# Patient Record
Sex: Female | Born: 1975 | Hispanic: No | Marital: Married
Health system: Southern US, Community
[De-identification: ages and names within clinical notes are randomized; demographics above are authoritative.]

## PROBLEM LIST (undated history)

## (undated) DIAGNOSIS — F329 Major depressive disorder, single episode, unspecified: Secondary | ICD-10-CM

## (undated) DIAGNOSIS — F32A Depression, unspecified: Secondary | ICD-10-CM

## (undated) DIAGNOSIS — G43909 Migraine, unspecified, not intractable, without status migrainosus: Secondary | ICD-10-CM

---

## 2018-05-30 ENCOUNTER — Other Ambulatory Visit: Payer: Self-pay

## 2018-05-30 ENCOUNTER — Emergency Department (HOSPITAL_BASED_OUTPATIENT_CLINIC_OR_DEPARTMENT_OTHER): Payer: Worker's Compensation

## 2018-05-30 ENCOUNTER — Encounter (HOSPITAL_BASED_OUTPATIENT_CLINIC_OR_DEPARTMENT_OTHER): Payer: Self-pay

## 2018-05-30 ENCOUNTER — Emergency Department (HOSPITAL_BASED_OUTPATIENT_CLINIC_OR_DEPARTMENT_OTHER)
Admission: EM | Admit: 2018-05-30 | Discharge: 2018-05-30 | Disposition: A | Discharge status: Home / Self Care | Payer: Worker's Compensation | Attending: Emergency Medicine | Admitting: Emergency Medicine

## 2018-05-30 DIAGNOSIS — Y9389 Activity, other specified: Secondary | ICD-10-CM | POA: Insufficient documentation

## 2018-05-30 DIAGNOSIS — S0990XA Unspecified injury of head, initial encounter: Secondary | ICD-10-CM

## 2018-05-30 DIAGNOSIS — W01198A Fall on same level from slipping, tripping and stumbling with subsequent striking against other object, initial encounter: Secondary | ICD-10-CM | POA: Diagnosis not present

## 2018-05-30 DIAGNOSIS — Y9289 Other specified places as the place of occurrence of the external cause: Secondary | ICD-10-CM | POA: Insufficient documentation

## 2018-05-30 DIAGNOSIS — S39012A Strain of muscle, fascia and tendon of lower back, initial encounter: Secondary | ICD-10-CM | POA: Diagnosis not present

## 2018-05-30 DIAGNOSIS — E039 Hypothyroidism, unspecified: Secondary | ICD-10-CM | POA: Diagnosis not present

## 2018-05-30 DIAGNOSIS — Y990 Civilian activity done for income or pay: Secondary | ICD-10-CM | POA: Insufficient documentation

## 2018-05-30 DIAGNOSIS — Z79899 Other long term (current) drug therapy: Secondary | ICD-10-CM | POA: Diagnosis not present

## 2018-05-30 DIAGNOSIS — W19XXXA Unspecified fall, initial encounter: Secondary | ICD-10-CM

## 2018-05-30 HISTORY — DX: Major depressive disorder, single episode, unspecified: F32.9

## 2018-05-30 HISTORY — DX: Depression, unspecified: F32.A

## 2018-05-30 HISTORY — DX: Migraine, unspecified, not intractable, without status migrainosus: G43.909

## 2018-05-30 LAB — PREGNANCY, URINE: Preg Test, Ur: NEGATIVE

## 2018-05-30 MED ORDER — METHOCARBAMOL 500 MG PO TABS: 500.0000 mg | ORAL_TABLET | Freq: Two times a day (BID) | ORAL | 0 refills | Status: AC

## 2018-05-30 MED ORDER — ACETAMINOPHEN 500 MG PO TABS
1000.0000 mg | ORAL_TABLET | Freq: Once | ORAL | Status: AC
  Administered 2018-05-30: 1000 mg via ORAL
  Filled 2018-05-30: qty 2

## 2018-05-30 MED ORDER — NAPROXEN 375 MG PO TABS: 375.0000 mg | ORAL_TABLET | Freq: Two times a day (BID) | ORAL | 0 refills | Status: AC

## 2018-05-30 MED ORDER — METHOCARBAMOL 500 MG PO TABS
500.0000 mg | ORAL_TABLET | Freq: Once | ORAL | Status: AC
  Administered 2018-05-30: 500 mg via ORAL
  Filled 2018-05-30: qty 1

## 2018-05-30 NOTE — Discharge Instructions (Addendum)
Please see the information and instructions below regarding your visit.  Your diagnoses today include:  1. Fall, initial encounter   2. Strain of lumbar region, initial encounter   3. Mild closed head injury, initial encounter    Your imaging is normal today. It shows no signs of bleeding in the brain. Concussions are caused by acceleration/deceleration forces of the brain against the skull and in mild forms, cannot be seen on any imaging. The injury occurs at the microscopic level, and causes a disturbance more in function than the structure of the brain itself. Common symptoms of concussion include: ?Poor coordination such as stumbling or inability to walk in a straight line ?Vacant stare (befuddled facial expression) ?Delayed verbal expression (slower to answer questions or follow instructions) ?Inability to focus attention (easily distracted and unable to follow through with normal activities) ?Disorientation (walking in the wrong direction, unaware of time, date, place) ?Slurred or incoherent speech (making disjointed or incomprehensible statements) ?Emotionality out of proportion to circumstances (appearing distraught, crying for no apparent reason) ?Memory deficits (exhibited by patient repeatedly asking the same question that has already been answered or inability to recall three of three words after five minutes)  Tests performed today include: CT scan showed no bleeding in the brain. See side panel of your discharge paperwork for testing performed today. Vital signs are listed at the bottom of these instructions.   X-ray of your lower back shows no fracture.  Medications prescribed:    Avoid aspirin. Take only ibuprofen (Advil) or naproxen (Aleve), and acetaminophen (Tylenol).  You are prescribed naproxen, a non-steroidal anti-inflammatory agent (NSAID) for pain. You may take 375mg  every 12 hours as needed for pain. If still requiring this medication around the clock for acute  pain after 10 days, please see your primary healthcare provider.  Women who are pregnant, breastfeeding, or planning on becoming pregnant should not take non-steroidal anti-inflammatories such as Advil and Aleve. Tylenol is a safe over the counter pain reliever in pregnant women.  You may combine this medication with Tylenol, 650 mg every 6 hours, so you are receiving something for pain every 3 hours.  This is not a long-term medication unless under the care and direction of your primary provider. Taking this medication long-term and not under the supervision of a healthcare provider could increase the risk of stomach ulcers, kidney problems, and cardiovascular problems such as high blood pressure.    You are prescribed Robaxin, a muscle relaxant. Some common side effects of this medication include:  Feeling sleepy.  Dizziness. Take care upon going from a seated to a standing position.  Dry mouth.  Feeling tired or weak.  Hard stools (constipation).  Upset stomach. These are not all of the side effects that may occur. If you have questions about side effects, call your doctor. Call your primary care provider for medical advice about side effects.  This medication can be sedating. Only take this medication as needed. Please do not combine with alcohol. Do not drive or operate machinery while taking this medication.   This medication can interact with some other medications. Make sure to tell any provider you are taking this medication before they prescribe you a new medication.   Take any prescribed medications only as prescribed, and any over the counter medications only as directed on the packaging.  Home care instructions:  Please follow any educational materials contained in this packet.    Keep head elevated at all times for the first 24 hours (Elevate  mattress if pillow is ineffective)  Do not take tranquilizers, sedatives, narcotics or alcohol  Use ice packs for  comfort  Follow-up instructions: Please follow-up with your primary care provider in one week for further evaluation of your symptoms if they are not completely improved.   Return instructions:  Please return to the Emergency Department if you experience worsening symptoms.   If any of the following occur notify your physician or go to the Hospital Emergency Department:  Increased drowsiness, confusion, or loss of consciousness  Restlessness or convulsions (fits)  Paralysis in arms or legs  Temperature above 100 F  Vomiting  Severe headache  Blood or clear fluid dripping from the nose or ears  Stiffness of the neck  Dizziness or blurred vision  Pulsating pain in the eye  Unequal pupils of eye  Personality changes  Any other unusual symptoms  Please return for any weakness in lower extremities, numbness, numbness in the groin, loss of bowel or bladder control.  Additional Information:   Your vital signs today were: BP 105/75 (BP Location: Right Arm)    Pulse (!) 59    Temp 97.9 F (36.6 C) (Oral)    Resp 18    Ht 5\' 2"  (1.575 m)    Wt 59 kg    LMP 05/30/2018    SpO2 100%    BMI 23.78 kg/m  If your blood pressure (BP) was elevated on multiple readings during this visit above 130 for the top number or above 80 for the bottom number, please have this repeated by your primary care provider within one month. --------------  Thank you for allowing [place] to participate in your care today. It was my pleasure to care for you!

## 2018-05-30 NOTE — ED Notes (Signed)
Patient ambulated to restroom with assistance.

## 2018-05-30 NOTE — ED Triage Notes (Signed)
Pt states she tripped over a palette and fell backwards, c/o head and lower back pain and thinks she passed out

## 2018-05-30 NOTE — ED Provider Notes (Signed)
[facility] EMERGENCY DEPARTMENT Provider Note   CSN: [id number] Arrival date & time: 05/30/18  1555     History   Chief Complaint Chief Complaint  Patient presents with  . Fall    HPI Sophia Gonzalez is a 42 y.o. female.  HPI   Patient is a 42 year old female with a history of migraine, hypothyroidism, and depression presenting for fall and subsequent head and low back pain.  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  Patient reports was a purely mechanical fall did not experience any presyncopal sensation prior to fall.  Patient denies loss of consciousness, but does report that she has difficulty remembering the events surrounding the fall.  She denies any visual disturbance, vomiting, or blood thinning medications.  Patient is also reporting low back pain, reports she has pain shooting down bilateral legs.  Patient denies any lower extreme any weakness, numbness, saddle anesthesia, loss of bowel or bladder control.  No remedies prior to arrival for symptoms.  Past Medical History:  Diagnosis Date  . Depression   . Migraine     There are no active problems to display for this patient.   Past Surgical History:  Procedure Laterality Date  . CESAREAN SECTION       OB History   None      Home Medications    Prior to Admission medications   Medication Sig Start Date End Date Taking? Authorizing Provider  levothyroxine (SYNTHROID, LEVOTHROID) 100 MCG tablet Take 100 mcg by mouth daily before breakfast.   Yes [provider]    Family History No family history on file.  Social History Social History   Tobacco Use  . Smoking status: Not on file  Substance Use Topics  . Alcohol use: Not on file  . Drug use: Not on file     Allergies   Patient has no known allergies.   Review of Systems Review of Systems  Eyes: Negative for visual disturbance.    Gastrointestinal: Negative for nausea and vomiting.  Musculoskeletal: Positive for back pain.  Neurological: Positive for dizziness and headaches. Negative for syncope, weakness, light-headedness and numbness.  All other systems reviewed and are negative.    Physical Exam Updated Vital Signs BP 105/75 (BP Location: Right Arm)   Pulse (!) 59   Temp 97.9 F (36.6 C) (Oral)   Resp 18   Ht 5\' 2"  (1.575 m)   Wt 59 kg   LMP 05/30/2018   SpO2 100%   BMI 23.78 kg/m   Physical Exam  Constitutional: She appears well-developed and well-nourished. No distress.  HENT:  Head: Normocephalic and atraumatic.  Mouth/Throat: Oropharynx is clear and moist.  Eyes: Pupils are equal, round, and reactive to light. Conjunctivae and EOM are normal.  Neck: Normal range of motion. Neck supple.  Cardiovascular: Normal rate, regular rhythm, S1 normal and S2 normal.  No murmur heard. Pulmonary/Chest: Effort normal and breath sounds normal. She has no wheezes. She has no rales.  Abdominal: Soft. She exhibits no distension. There is no tenderness. There is no guarding.  Musculoskeletal: Normal range of motion. She exhibits no edema or deformity.  Spine Exam: Inspection/Palpation: No midline tenderness to palpation of cervical, thoracic, or lumbar spine.  Patient does have diffuse right-sided lumbar paraspinal muscular tenderness, and mild left-sided lumbar paraspinal muscular tenderness. Strength: 5/5 throughout LE bilaterally (hip flexion/extension, adduction/abduction; knee flexion/extension; foot dorsiflexion/plantarflexion, inversion/eversion;  great toe inversion) Sensation: Intact to light touch in proximal and distal LE bilaterally Reflexes: 2+ quadriceps and achilles reflexes Slight antalgic gait, favoring right.  Lymphadenopathy:    She has no cervical adenopathy.  Neurological: She is alert. GCS eye subscore is 4. GCS verbal subscore is 5. GCS motor subscore is 6.  Cranial nerves grossly  intact. Patient moves extremities symmetrically and with good coordination.  Skin: Skin is warm and dry. No rash noted. No erythema.  Psychiatric: She has a normal mood and affect. Her behavior is normal. Judgment and thought content normal.  Nursing note and vitals reviewed.    ED Treatments / Results  Labs (all labs ordered are listed, but only abnormal results are displayed) Labs Reviewed  PREGNANCY, URINE    EKG None  Radiology Dg Lumbar Spine Complete  Result Date: 05/30/2018 CLINICAL DATA:  Lumbar pain radiating down RIGHT lower extremity after a fall today, tripped over pallets at work EXAM: LUMBAR SPINE - COMPLETE 4+ VIEW COMPARISON:  None FINDINGS: 5 non-rib-bearing lumbar vertebra. Osseous mineralization normal for technique. Vertebral body and disc space heights maintained. No acute fracture, subluxation, or bone destruction. No spondylolysis. SI joints preserved. IMPRESSION: Normal exam. Electronically Signed   By: Ulyses SouthwardMark  Boles M.D.   On: 05/30/2018 17:51   Ct Head Wo Contrast  Result Date: 05/30/2018 CLINICAL DATA:  Patient tripped over a pallet and fell backwards, presenting with head and low back pain. Patient believes she passed out. EXAM: CT HEAD WITHOUT CONTRAST TECHNIQUE: Contiguous axial images were obtained from the base of the skull through the vertex without intravenous contrast. COMPARISON:  None. FINDINGS: Brain: No evidence of acute infarction, hemorrhage, hydrocephalus, extra-axial collection or mass lesion/mass effect. Vascular: No hyperdense vessel or unexpected calcification. Skull: Negative for fracture or focal lesion. Sinuses/Orbits: No acute finding. Other: None IMPRESSION: No skull fracture nor acute intracranial abnormality identified. Electronically Signed   By: Tollie Ethavid  Kwon M.D.   On: 05/30/2018 17:28    Procedures Procedures (including critical care time)  Medications Ordered in ED Medications  acetaminophen (TYLENOL) tablet 1,000 mg (1,000 mg  Oral Given 05/30/18 1728)  methocarbamol (ROBAXIN) tablet 500 mg (500 mg Oral Given 05/30/18 1948)     Initial Impression / Assessment and Plan / ED Course  I have reviewed the triage vital signs and the nursing notes.  Pertinent labs & imaging results that were available during my care of the patient were reviewed by me and considered in my medical decision making (see chart for details).  Clinical Course as of May 30 1958  Tue May 30, 2018  1946 Patient verbally verified a safe ride from the ED. Proceeded with prescribing Robaxin for pain/relaxtion/muscle relaxation in the ED.   [AM]    Clinical Course User Index [AM] Elisha PonderMurray, Aryannah Mohon B, PA-C    Patient is well-appearing and in no acute distress.  Patient with mechanical fall from standing.  Patient did have some possible retrograde amnesia, but no other neurologic symptoms.  Work-up significant for negative head CT for intracranial bleeding, and negative lumbar spine radiographs for acute fracture or malalignment.  We will treat patient for muscular skeletal strain, with rest, gentle exercises, naproxen, and muscle relaxants.  Patient instructed not to drive, drink alcohol, operate machinery while taking muscle relaxants.  Return precautions were given for any changes in mentation, persistent vomiting, saddle anesthesia, loss of bowel bladder control or numbness or weakness in lower extremities.  Patient is in understanding and agrees with the plan of  care.  I also discussed concussion with the patient, and gave return precautions as well as follow-up instructions for this.  Final Clinical Impressions(s) / ED Diagnoses   Final diagnoses:  Fall, initial encounter  Strain of lumbar region, initial encounter  Mild closed head injury, initial encounter    ED Discharge Orders         Ordered    methocarbamol (ROBAXIN) 500 MG tablet  2 times daily     05/30/18 2008    naproxen (NAPROSYN) 375 MG tablet  2 times daily     05/30/18 2008            Delia Chimes 05/30/18 2012    Arby Barrette, MD 05/31/18 2340

## 2019-05-27 IMAGING — CR DG LUMBAR SPINE COMPLETE 4+V
5 series · 5 of 5 positions shown · non-contrast
Comparison: None

CLINICAL DATA: Lumbar pain radiating down RIGHT lower extremity
after a fall today, tripped over pallets at work

EXAM:
LUMBAR SPINE - COMPLETE 4+ VIEW

[t l-spine a.p.]
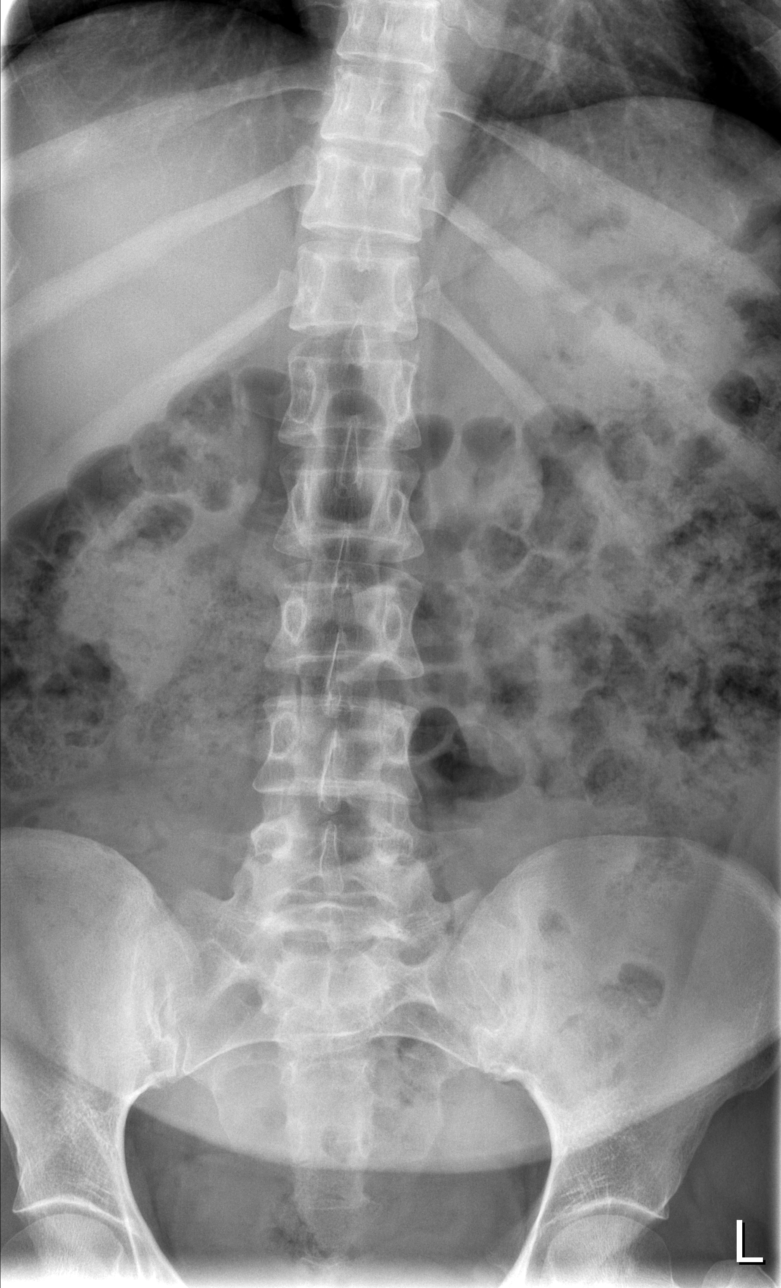

[t l-spine oblique exposure (1 of 2)]
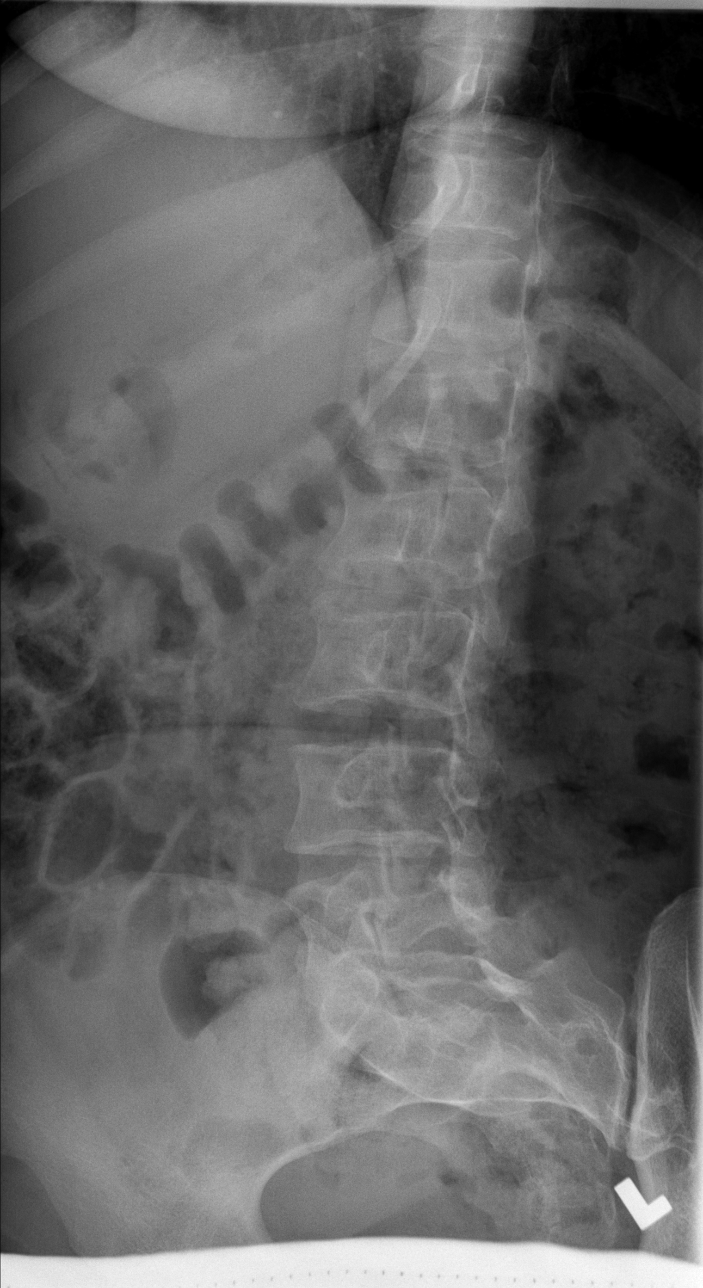

[t l-spine oblique exposure (2 of 2)]
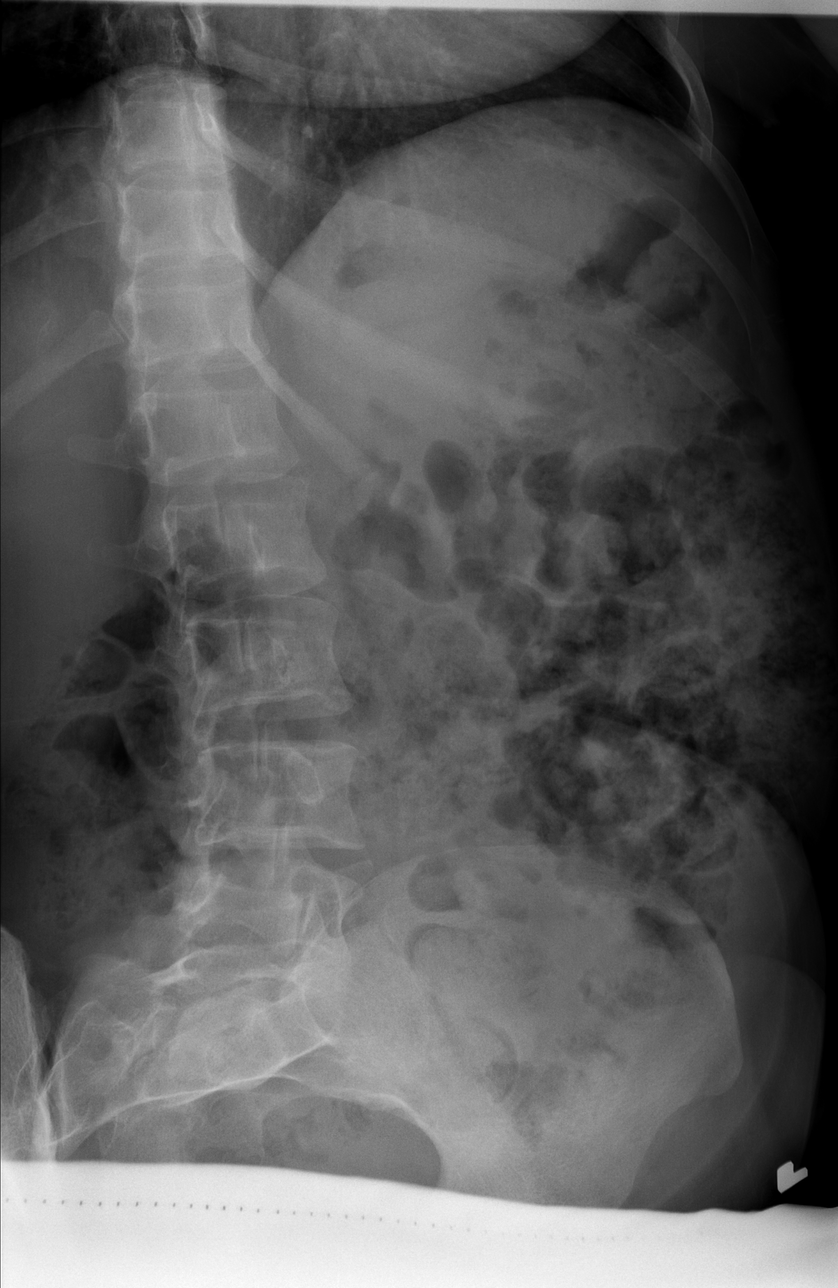

[t l-spine lat]
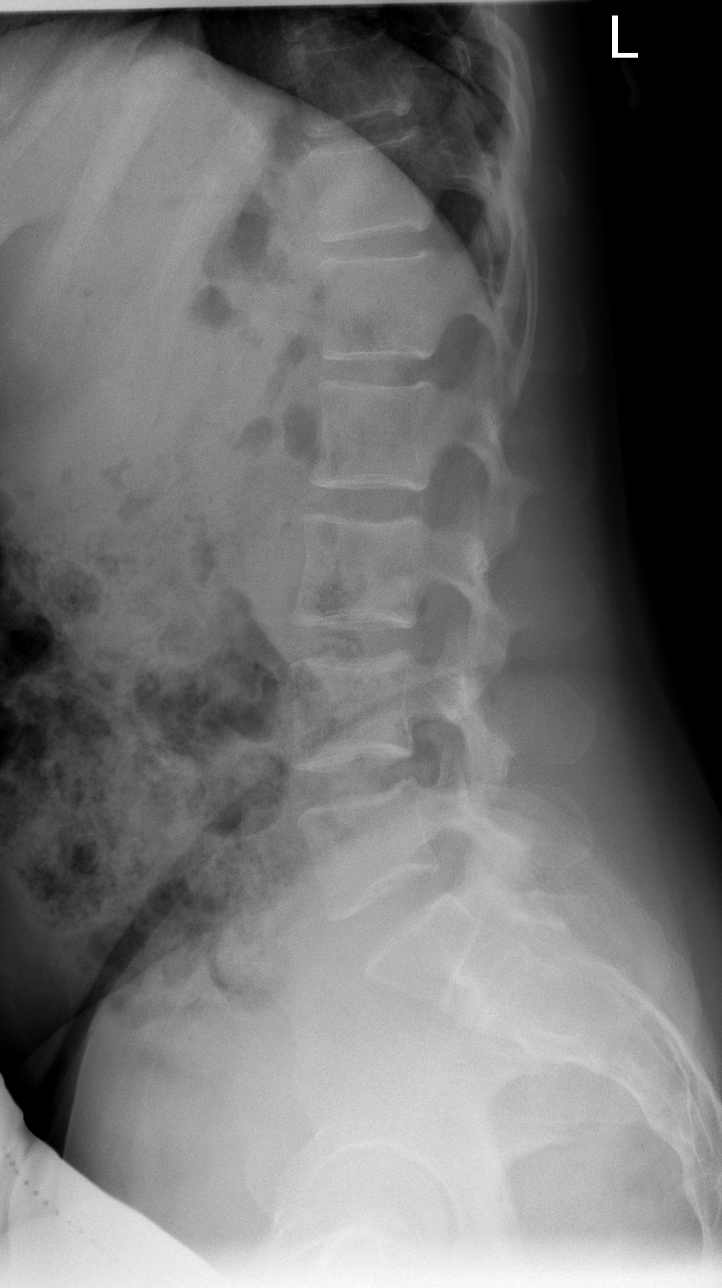

[t l-spine l5-s1 spot]
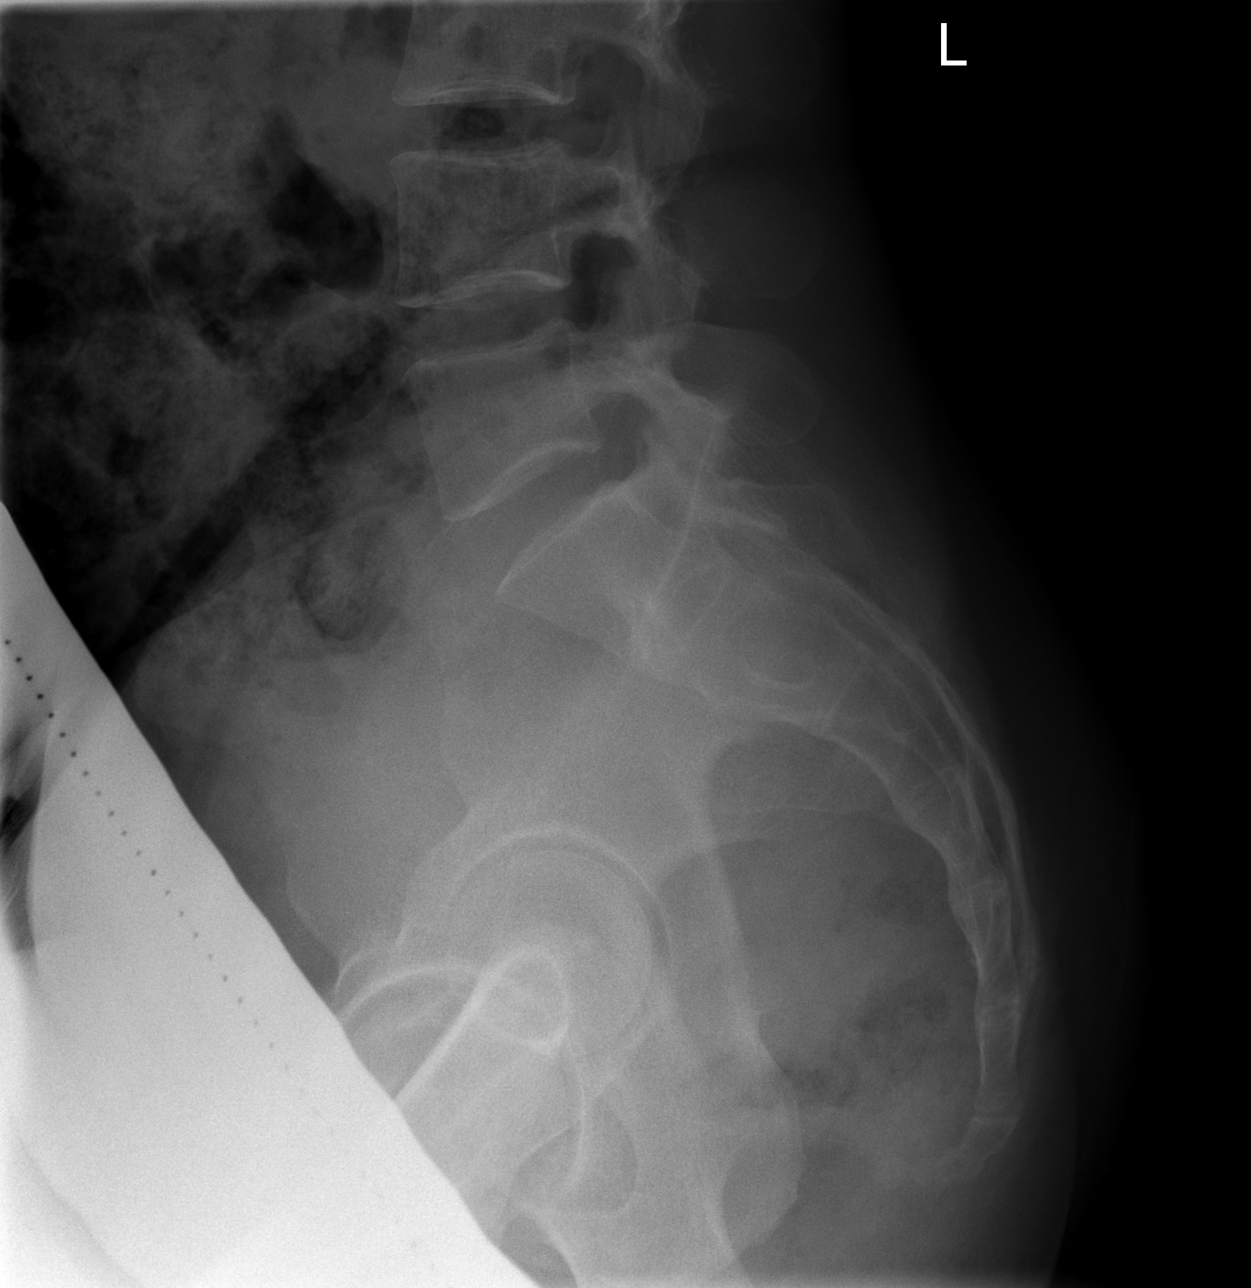

[5 of 5 positions shown; findings below may reference images not displayed]

FINDINGS: 5 non-rib-bearing lumbar vertebra.

Osseous mineralization normal for technique.

Vertebral body and disc space heights maintained.

No acute fracture, subluxation, or bone destruction.

No spondylolysis.

SI joints preserved.
IMPRESSION: Normal exam.

## 2019-05-27 IMAGING — CT CT HEAD W/O CM
3 series · 15 of 47 positions shown, 18 images · non-contrast
Comparison: None.

CLINICAL DATA: Patient tripped over a pallet and fell backwards,
presenting with head and low back pain. Patient believes she passed
out.

EXAM:
CT HEAD WITHOUT CONTRAST
TECHNIQUE: Contiguous axial images were obtained from the base of the skull
through the vertex without intravenous contrast.

[Series 2: head (person_name) (person_name) · axial · 0.39mm/px · z∈[-186,-56]mm · 9 of 32 slices shown, 12 images]
[im 3/32  brain]
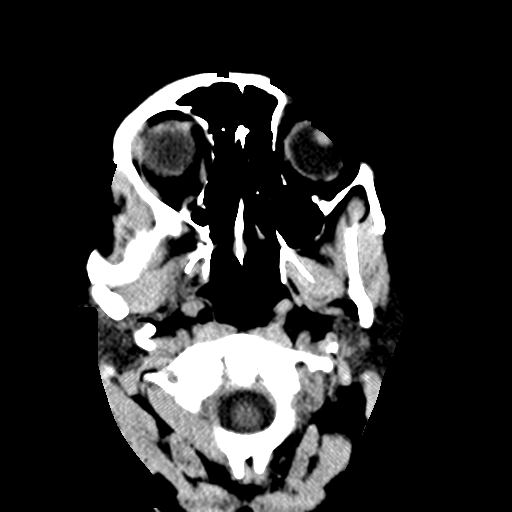
[im 3/32  bone]
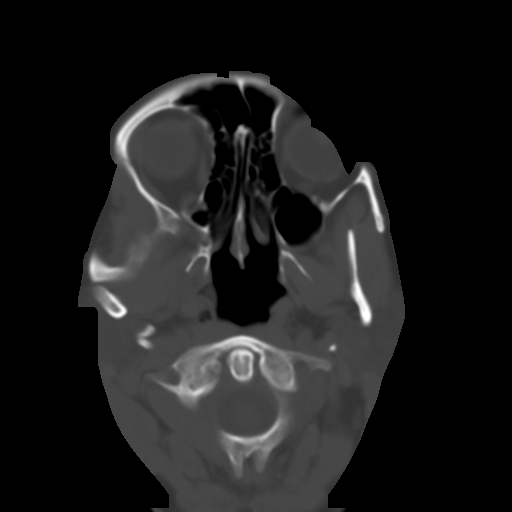
[im 6/32  brain]
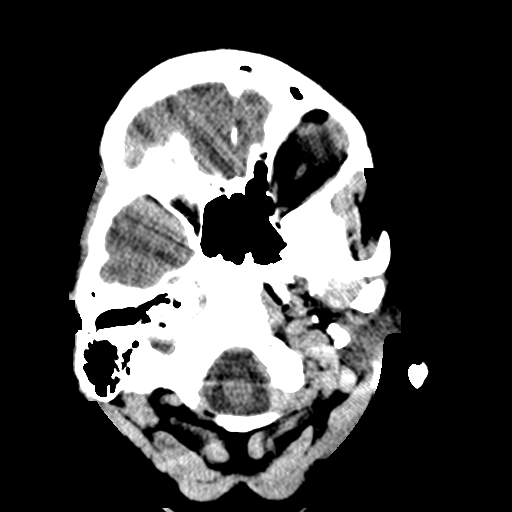
[im 9/32  brain]
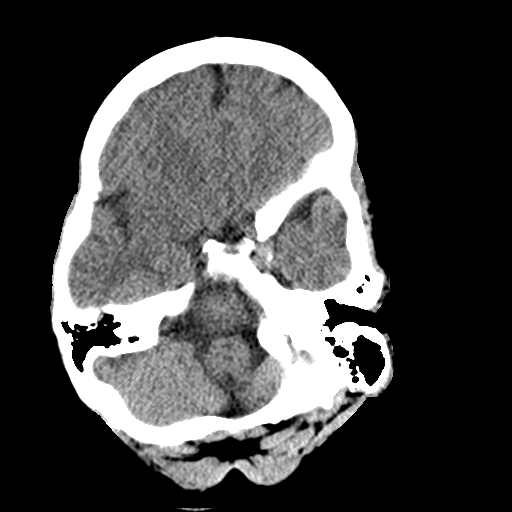
[im 12/32  brain]
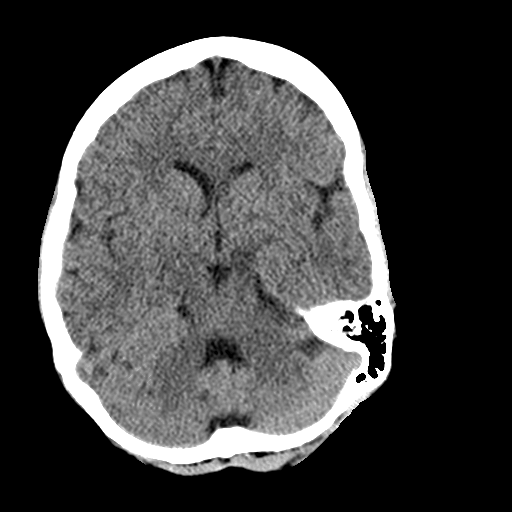
[im 17/32  brain]
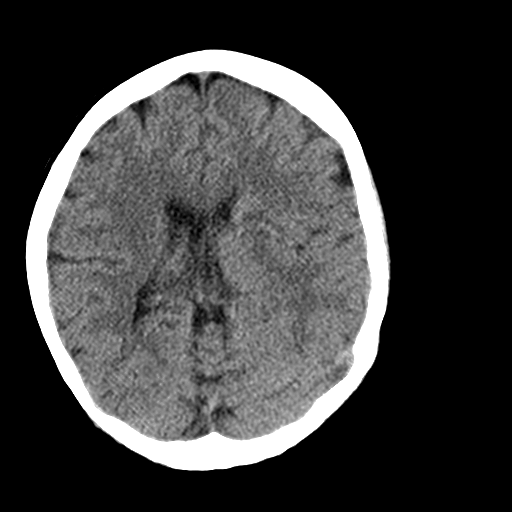
[im 17/32  bone]
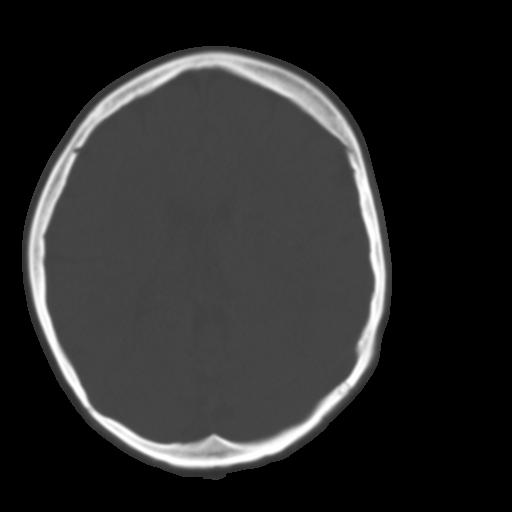
[im 20/32  brain]
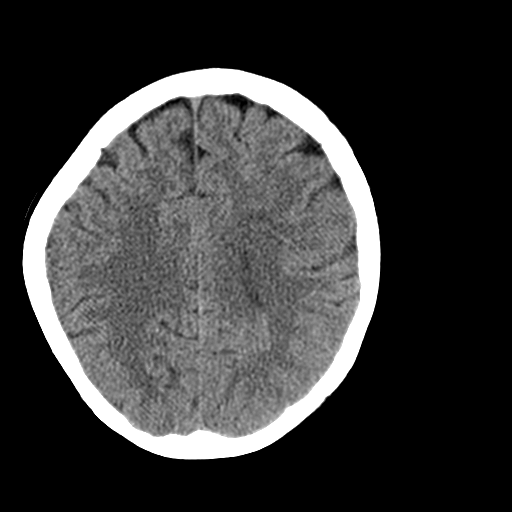
[im 23/32  brain]
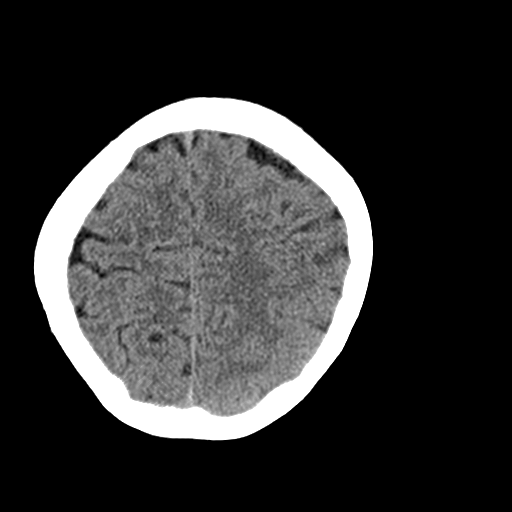
[im 26/32  brain]
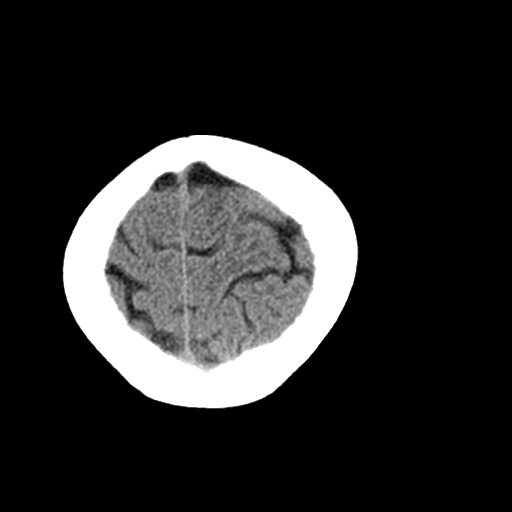
[im 29/32  brain]
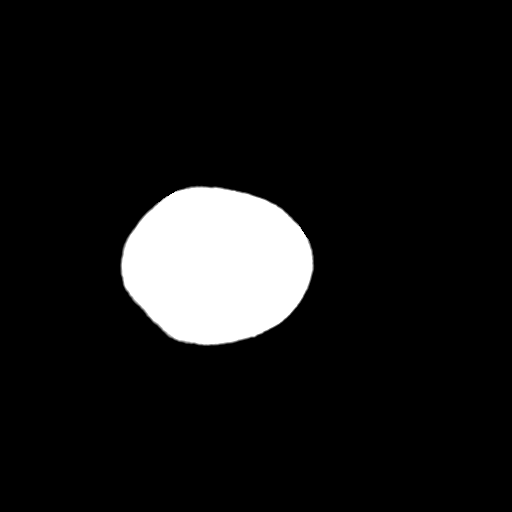
[im 29/32  bone]
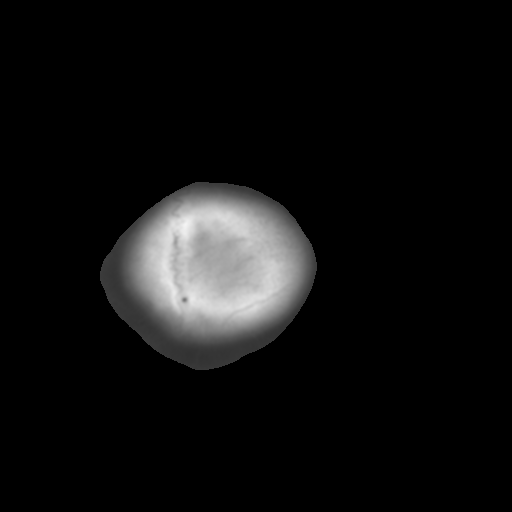

[Series 4: cor soft · coronal · 0.32mm/px · 3 of 63 slices shown]
[im 21/63  brain]
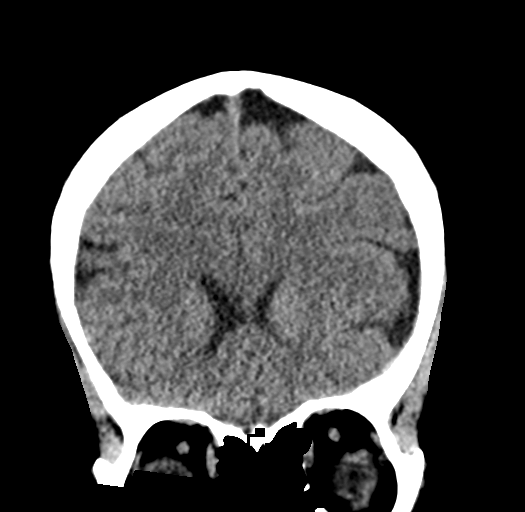
[im 28/63  brain]
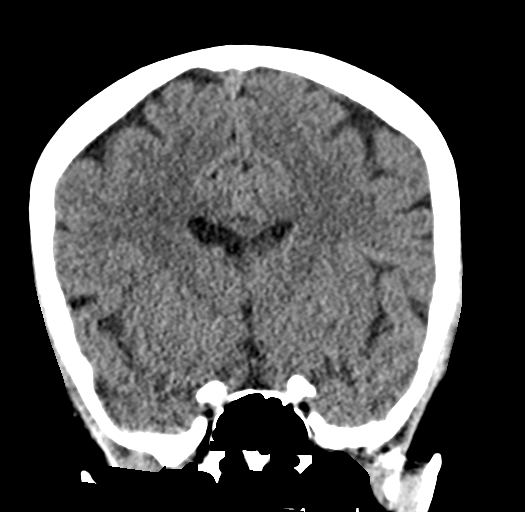
[im 35/63  brain]
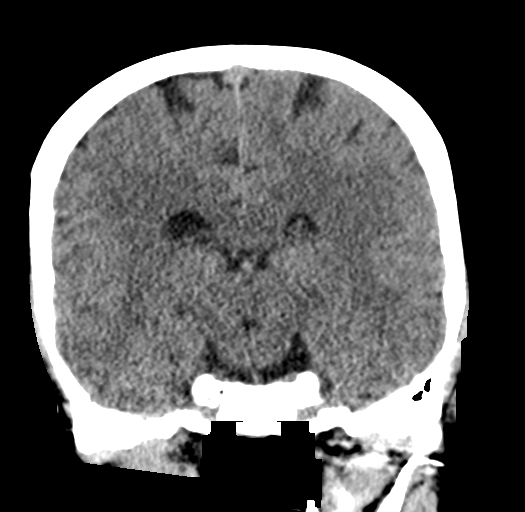

[Series 5: sag soft · sagittal · 0.31mm/px · 3 of 55 slices shown]
[im 19/55  brain]
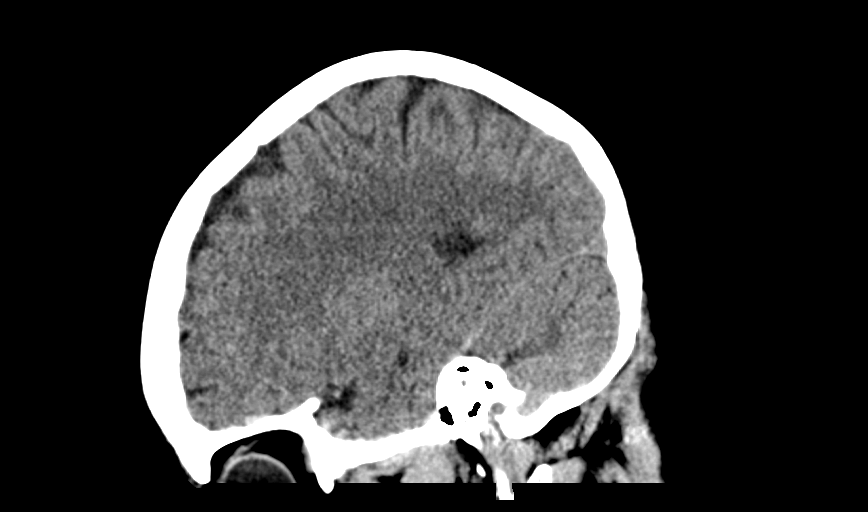
[im 28/55  brain]
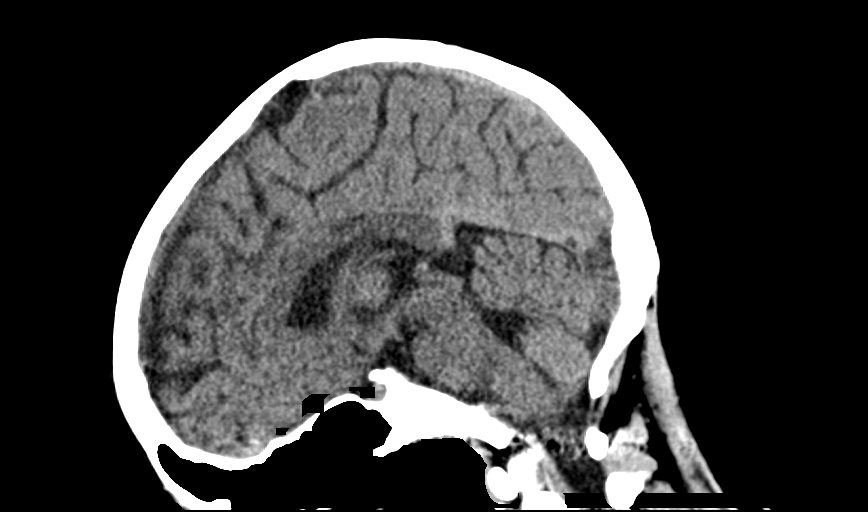
[im 37/55  brain]
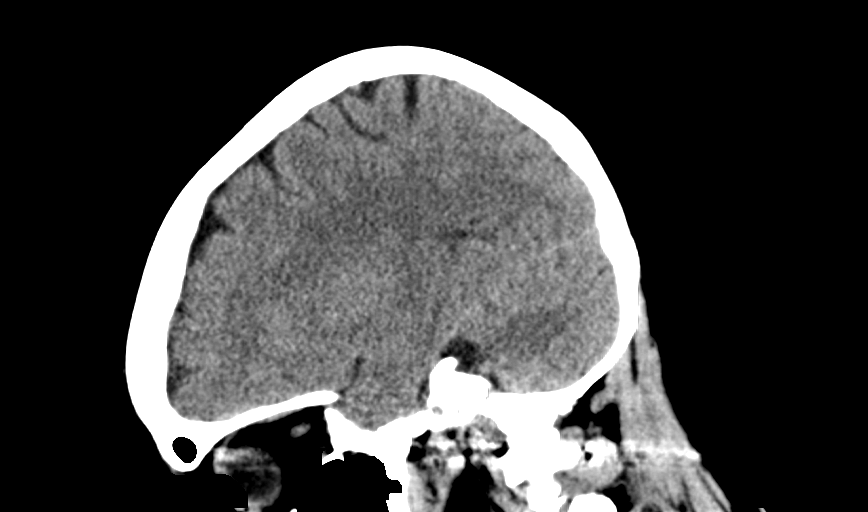

[15 of 47 positions shown; findings below may reference images not displayed]

FINDINGS: Brain: No evidence of acute infarction, hemorrhage, hydrocephalus,
extra-axial collection or mass lesion/mass effect.

Vascular: No hyperdense vessel or unexpected calcification.

Skull: Negative for fracture or focal lesion.

Sinuses/Orbits: No acute finding.

Other: None
IMPRESSION: No skull fracture nor acute intracranial abnormality identified.
